# Patient Record
Sex: Male | Born: 1960
Health system: Southern US, Community
[De-identification: ages and names within clinical notes are randomized; demographics above are authoritative.]

## PROBLEM LIST (undated history)

## (undated) DIAGNOSIS — M549 Dorsalgia, unspecified: Secondary | ICD-10-CM

## (undated) DIAGNOSIS — K219 Gastro-esophageal reflux disease without esophagitis: Secondary | ICD-10-CM

## (undated) DIAGNOSIS — R1013 Epigastric pain: Secondary | ICD-10-CM

## (undated) DIAGNOSIS — M47812 Spondylosis without myelopathy or radiculopathy, cervical region: Secondary | ICD-10-CM

## (undated) DIAGNOSIS — L309 Dermatitis, unspecified: Secondary | ICD-10-CM

## (undated) DIAGNOSIS — G4700 Insomnia, unspecified: Secondary | ICD-10-CM

## (undated) DIAGNOSIS — S99919A Unspecified injury of unspecified ankle, initial encounter: Secondary | ICD-10-CM

## (undated) DIAGNOSIS — J329 Chronic sinusitis, unspecified: Secondary | ICD-10-CM

## (undated) DIAGNOSIS — C439 Malignant melanoma of skin, unspecified: Secondary | ICD-10-CM

## (undated) HISTORY — DX: Malignant melanoma of skin, unspecified: C43.9

## (undated) HISTORY — PX: MOHS SURGERY: SHX181

## (undated) HISTORY — DX: Unspecified injury of unspecified ankle, initial encounter: S99.919A

## (undated) HISTORY — DX: Dorsalgia, unspecified: M54.9

## (undated) HISTORY — DX: Spondylosis without myelopathy or radiculopathy, cervical region: M47.812

## (undated) HISTORY — DX: Insomnia, unspecified: G47.00

## (undated) HISTORY — DX: Chronic sinusitis, unspecified: J32.9

## (undated) HISTORY — DX: Epigastric pain: R10.13

## (undated) HISTORY — DX: Dermatitis, unspecified: L30.9

## (undated) HISTORY — DX: Gastro-esophageal reflux disease without esophagitis: K21.9

---

## 1990-07-31 HISTORY — PX: KNEE SURGERY: SHX244

## 1998-04-12 ENCOUNTER — Emergency Department (HOSPITAL_COMMUNITY): Admission: EM | Admit: 1998-04-12 | Discharge: 1998-04-12 | Payer: Self-pay | Admitting: *Deleted

## 1999-05-19 ENCOUNTER — Encounter: Payer: Self-pay | Admitting: Emergency Medicine

## 1999-05-19 ENCOUNTER — Emergency Department (HOSPITAL_COMMUNITY): Admission: EM | Admit: 1999-05-19 | Discharge: 1999-05-19 | Payer: Self-pay | Admitting: Emergency Medicine

## 2001-05-26 ENCOUNTER — Emergency Department (HOSPITAL_COMMUNITY): Admission: EM | Admit: 2001-05-26 | Discharge: 2001-05-26 | Payer: Self-pay | Admitting: Emergency Medicine

## 2001-05-26 ENCOUNTER — Encounter: Payer: Self-pay | Admitting: Emergency Medicine

## 2002-10-16 ENCOUNTER — Emergency Department (HOSPITAL_COMMUNITY): Admission: EM | Admit: 2002-10-16 | Discharge: 2002-10-16 | Payer: Self-pay | Admitting: Emergency Medicine

## 2002-10-16 ENCOUNTER — Encounter: Payer: Self-pay | Admitting: Emergency Medicine

## 2003-03-04 ENCOUNTER — Encounter: Payer: Self-pay | Admitting: Emergency Medicine

## 2003-03-04 ENCOUNTER — Emergency Department (HOSPITAL_COMMUNITY): Admission: EM | Admit: 2003-03-04 | Discharge: 2003-03-04 | Payer: Self-pay | Admitting: Emergency Medicine

## 2005-02-27 ENCOUNTER — Emergency Department (HOSPITAL_COMMUNITY): Admission: EM | Admit: 2005-02-27 | Discharge: 2005-02-28 | Payer: Self-pay | Admitting: Emergency Medicine

## 2005-07-31 HISTORY — PX: FACET JOINT INJECTION: SHX5016

## 2005-08-14 ENCOUNTER — Ambulatory Visit: Payer: Self-pay | Admitting: Physical Medicine & Rehabilitation

## 2005-08-14 ENCOUNTER — Encounter
Admission: RE | Admit: 2005-08-14 | Discharge: 2005-11-12 | Payer: Self-pay | Admitting: Physical Medicine & Rehabilitation

## 2005-11-08 ENCOUNTER — Ambulatory Visit: Payer: Self-pay | Admitting: Physical Medicine & Rehabilitation

## 2005-11-08 ENCOUNTER — Encounter
Admission: RE | Admit: 2005-11-08 | Discharge: 2006-02-06 | Payer: Self-pay | Admitting: Physical Medicine & Rehabilitation

## 2006-01-05 ENCOUNTER — Ambulatory Visit: Payer: Self-pay | Admitting: Physical Medicine & Rehabilitation

## 2006-01-19 ENCOUNTER — Emergency Department (HOSPITAL_COMMUNITY): Admission: EM | Admit: 2006-01-19 | Discharge: 2006-01-19 | Payer: Self-pay | Admitting: Emergency Medicine

## 2006-02-07 ENCOUNTER — Encounter
Admission: RE | Admit: 2006-02-07 | Discharge: 2006-05-08 | Payer: Self-pay | Admitting: Physical Medicine & Rehabilitation

## 2006-02-07 ENCOUNTER — Ambulatory Visit: Payer: Self-pay | Admitting: Physical Medicine & Rehabilitation

## 2006-04-18 ENCOUNTER — Ambulatory Visit: Payer: Self-pay | Admitting: Physical Medicine & Rehabilitation

## 2006-05-30 ENCOUNTER — Ambulatory Visit: Payer: Self-pay | Admitting: Physical Medicine & Rehabilitation

## 2006-05-30 ENCOUNTER — Encounter
Admission: RE | Admit: 2006-05-30 | Discharge: 2006-08-28 | Payer: Self-pay | Admitting: Physical Medicine & Rehabilitation

## 2006-07-31 DIAGNOSIS — J329 Chronic sinusitis, unspecified: Secondary | ICD-10-CM

## 2006-07-31 HISTORY — DX: Chronic sinusitis, unspecified: J32.9

## 2007-07-12 ENCOUNTER — Emergency Department (HOSPITAL_COMMUNITY): Admission: EM | Admit: 2007-07-12 | Discharge: 2007-07-13 | Payer: Self-pay | Admitting: Emergency Medicine

## 2007-11-21 IMAGING — CT CT ABDOMEN W/ CM
1 of 3 series · 14 of 32 positions shown, 19 images · IV contrast (omnipaque)
Comparison: None available.

CLINICAL DATA: Back, leg and abdominal pain.  
 ABDOMEN CT WITH CONTRAST:
TECHNIQUE: Multidetector CT imaging of the abdomen was performed following the standard protocol during bolus administration of intravenous contrast.
 Contrast:  125 cc Omnipaque 300.
TECHNIQUE: Multidetector CT imaging of the pelvis was performed following the standard protocol during bolus administration of intravenous contrast.

[Series 2: abd_pel 5.0 b40f st · axial · 0.76mm/px · z∈[-510,-50]mm · 14 of 102 slices shown, 19 images]
[im 5/102  soft-tissue]
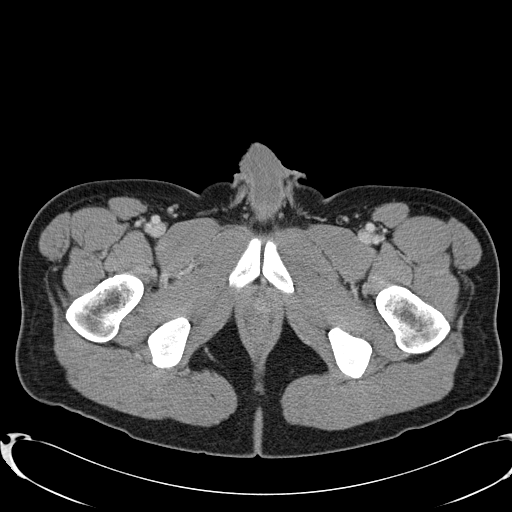
[im 5/102  bone]
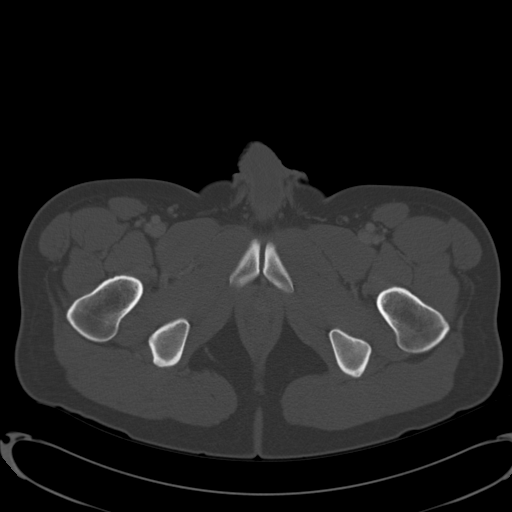
[im 15/102  soft-tissue]
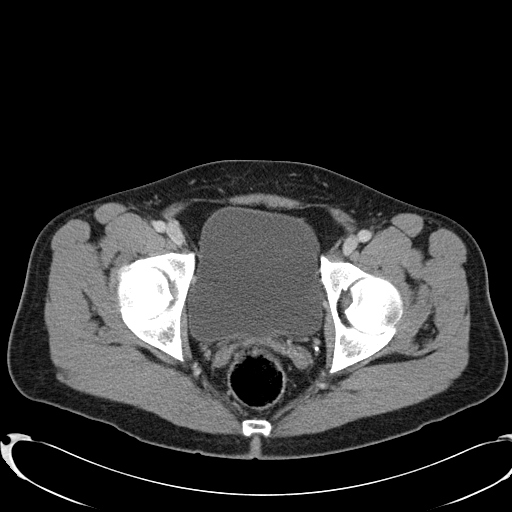
[im 20/102  soft-tissue]
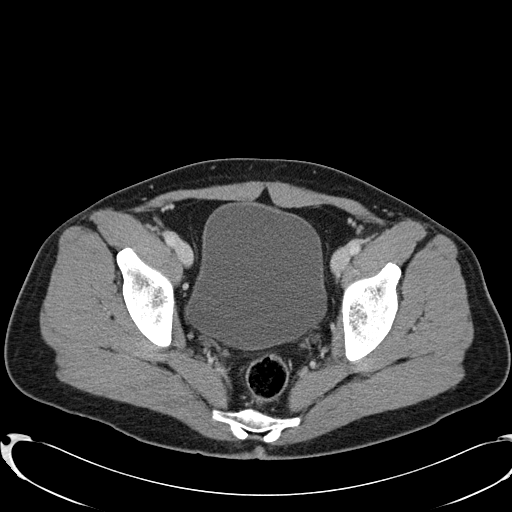
[im 29/102  soft-tissue]
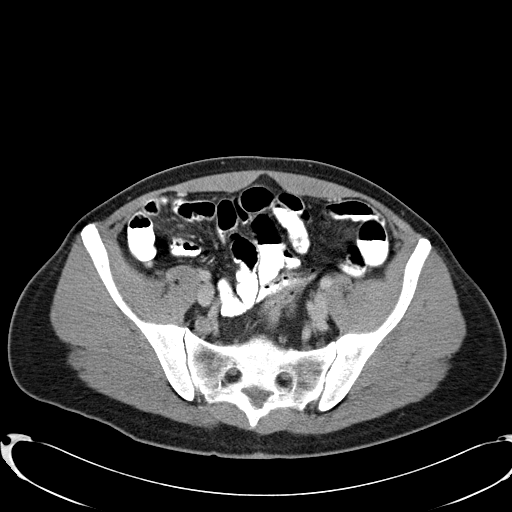
[im 34/102  soft-tissue]
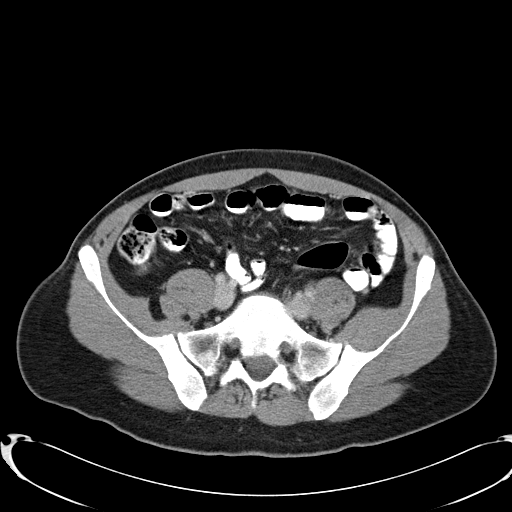
[im 44/102  soft-tissue]
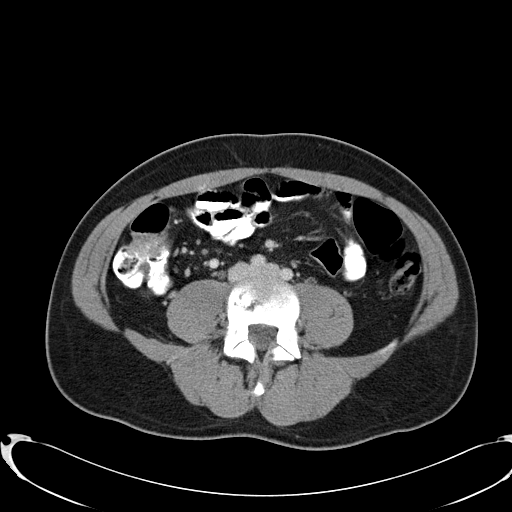
[im 53/102  soft-tissue]
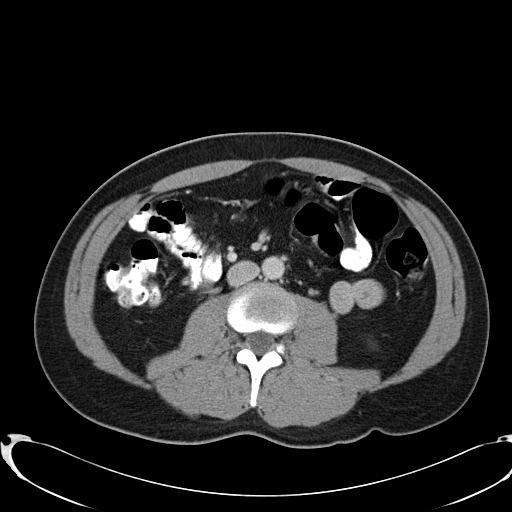
[im 58/102  soft-tissue]
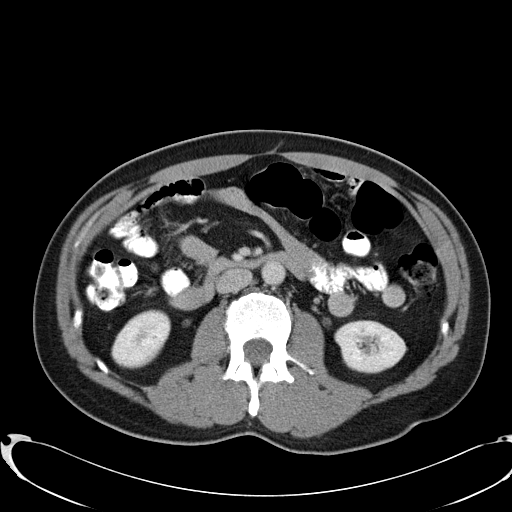
[im 68/102  soft-tissue]
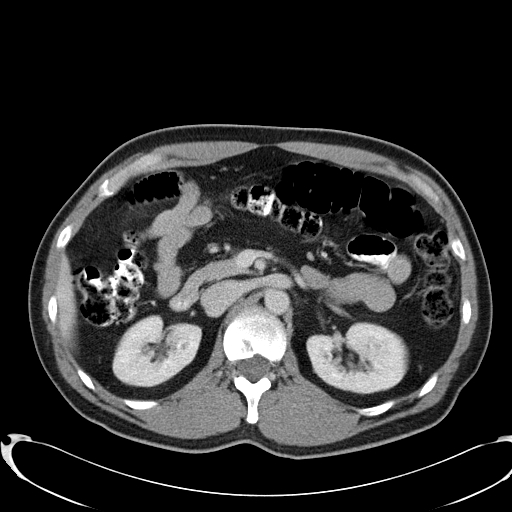
[im 68/102  bone]
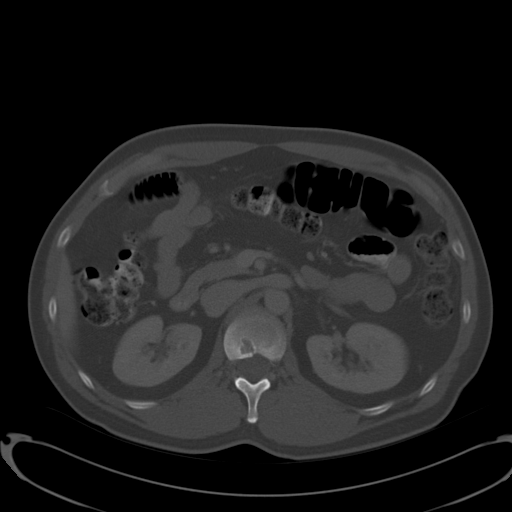
[im 73/102  soft-tissue]
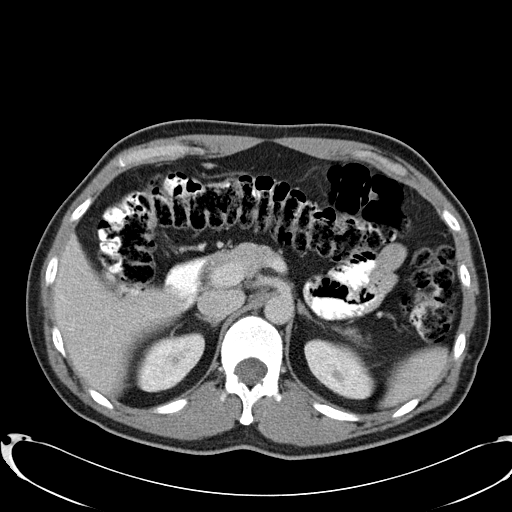
[im 82/102  soft-tissue]
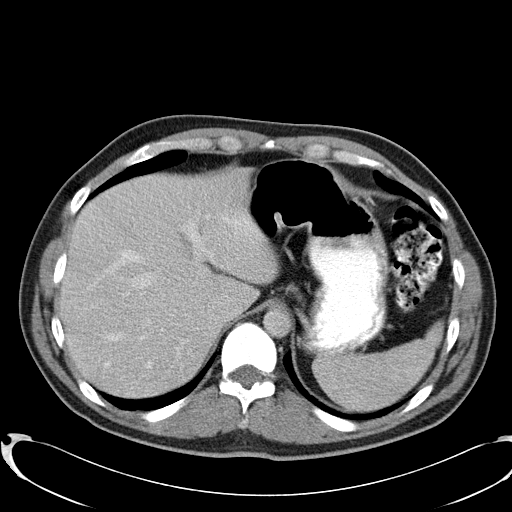
[im 82/102  lung]
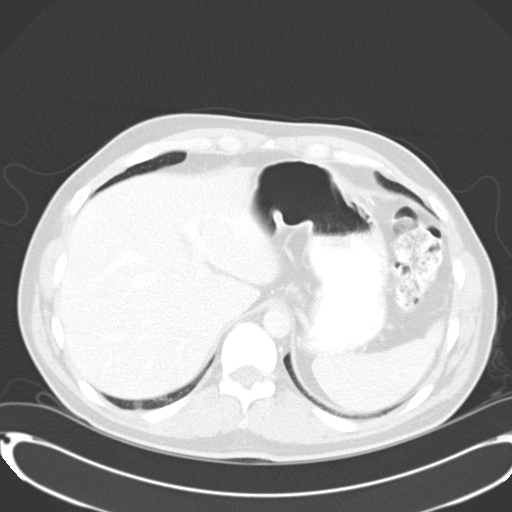
[im 87/102  soft-tissue]
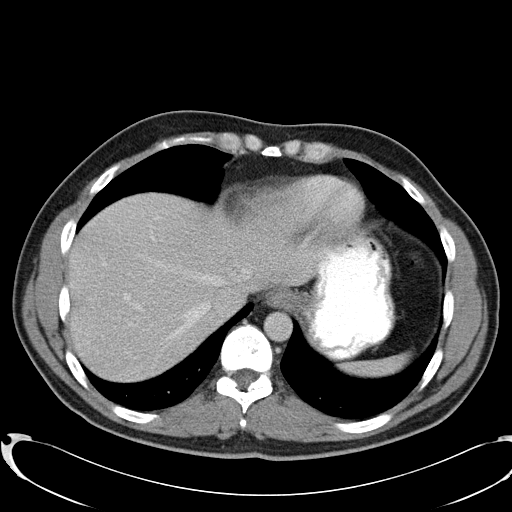
[im 87/102  lung]
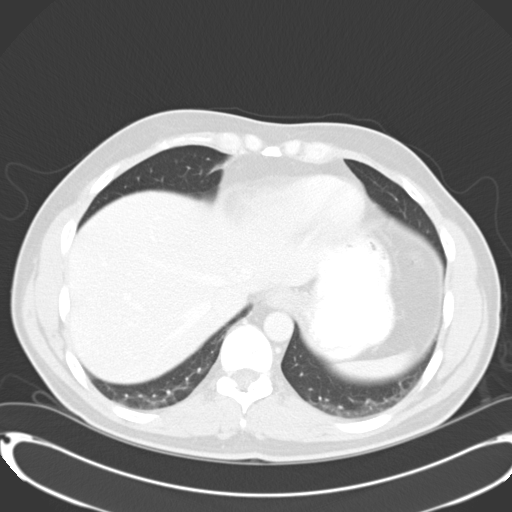
[im 92/102  lung]
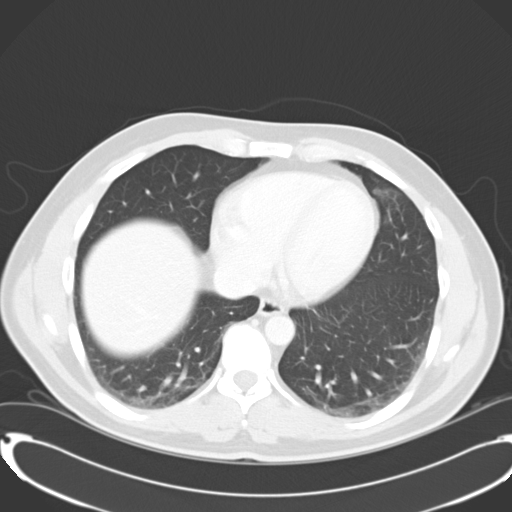
[im 97/102  soft-tissue]
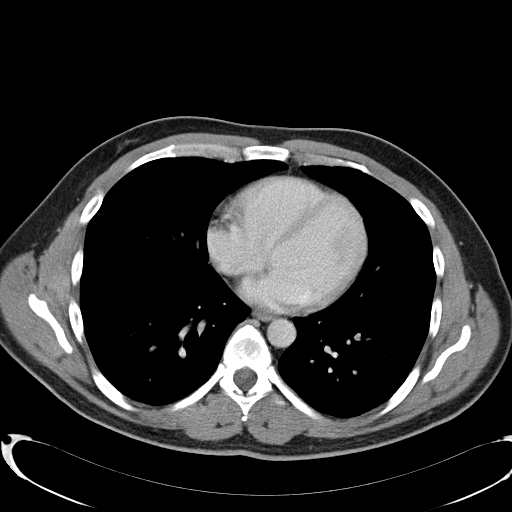
[im 97/102  lung]
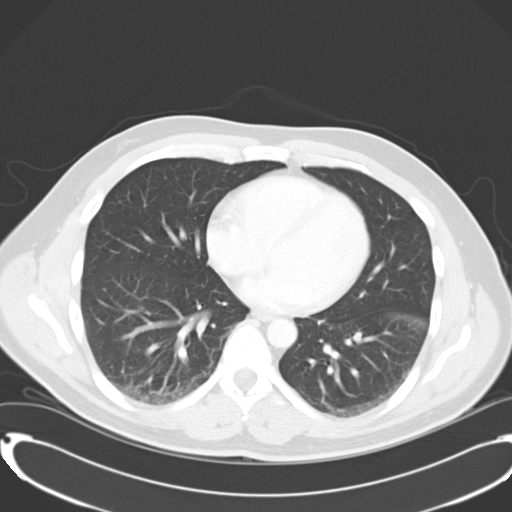

[14 of 32 positions shown; findings below may reference images not displayed]

FINDINGS: There is some dependent atelectatic change in the lung base.  No pleural or pericardial effusion.  
 Three tiny low attenuation lesions are seen in the left kidney which are too small to characterize but likely represent cysts.  The right kidney appears normal.  The liver, gallbladder, spleen, pancreas, and adrenal glands all appear normal.  The stomach and small bowel have a normal CT appearance.  No abdominal lymphadenopathy or abnormal fluid collection.
IMPRESSION: No acute finding in the abdomen. 
 PELVIS CT WITH CONTRAST:
FINDINGS: The appendix is well visualized and appears normal.   There is no pelvic fluid collection or lymphadenopathy.  The patient is known to have a redundant sigmoid colon but colon is otherwise unremarkable in appearance without evidence of diverticulitis or diverticulosis.  No pelvic fluid collection or lymphadenopathy.  No focal bony abnormality. 
 Tiny splenic focus in the right iliac wing likely represents a tiny bone island.  There is some degenerative change in the spine.
IMPRESSION: No acute finding in the pelvis.

## 2008-07-31 DIAGNOSIS — C439 Malignant melanoma of skin, unspecified: Secondary | ICD-10-CM

## 2008-07-31 HISTORY — DX: Malignant melanoma of skin, unspecified: C43.9

## 2010-12-16 NOTE — Procedures (Signed)
NAMEJARION, Jonathan Todd                ACCOUNT NO.:  [id number]   MEDICAL RECORD NO.:  [id number]          PATIENT TYPE:  REC   LOCATION:  [facility]                          FACILITY:  [facility]   PHYSICIAN:  Erick Colace, M.D.DATE OF BIRTH:  1960-11-11   DATE OF PROCEDURE:  11/09/2005  DATE OF DISCHARGE:                                 OPERATIVE REPORT   PROCEDURE:  Bilateral L4 and L3 medial branch blocks, bilateral L5 dorsal  ramus injections under fluoroscopic guidance.   INDICATIONS FOR PROCEDURE:  Prior medial branch blocks which increased his  level of activity and decreased morning stiffness performed September 11, 2005.   Informed consent was obtained after describing the risks and benefits of the  procedure to the patient and these include bleeding, bruising, infection,  loss of bowel or bladder function, temporary or permanent paralysis. She  elected to proceed and was given written consent. The patient was placed  prone on the fluoroscopy table, Betadine prep and sterile drape. A 25 gauge  inch and a half needle was used to anesthetize the skin and subcu tissue  with 1% lidocaine x2 mL at each of 6 sites. Then a 22 gauge 3-1/2 inch  spinal needle was inserted first on the left to target the left S1 SAP  transprocess junction, bone contact made confirmed with lateral imaging.  Omnipaque 180 x 0.5 mL demonstrated no intravascular uptake and 0.5 mL of a  solution containing 1 mL of 40 mg/mL Depo-Medrol and 2 mL of 2% MP free  lidocaine was injected. The right S1 SAP sacroiliac junction was targeted,  bone contact made and confirmed with lateral imaging. Omnipaque 180 x 0.5 mL  demonstrated no intravascular uptake and then 0.5 mL of Depo-Medrol  lidocaine solution were injected. Next, C-arm was oblique 10 degrees short  to the right with targeting right L5 SAP transprocess junction, bone contact  made and confirmed with lateral imaging. Omnipaque 180 x 0.5 mL demonstrated  no  intravascular uptake and then 0.5 mL of Depo-Medrol lidocaine solution  was injected then the right L4 SAP transprocess junction targeted and bone  contact made confirmed with lateral imaging. Omnipaque 180 x 0.5 mL  demonstrated no intravascular uptake and 0.5 mL of Depo-Medrol lidocaine  solution were injected. Next, C-arm was oblique towards the left, 10 degrees  left L4 SAP transprocess junction targeted bone contact made confirming  lateral imaging. Omnipaque 180 x 0.5 mL demonstrated no intravascular uptake  and 0.5 mL of Depo-Medrol lidocaine solution injected and then the left L5  SAP transprocess junction targeted, bone contact made, confirmed with  lateral imaging. Omnipaque 180 x 0.5 mL demonstrated no intravascular uptake  and 0.5 mL of Depo-Medrol lidocaine solution was injected. The patient  tolerated the procedure well, post injection instructions given.      Erick Colace, M.D.  Electronically Signed     AEK/MEDQ  D:  11/09/2005 17:49:06  T:  11/10/2005 07:48:32  Job:  [id number]

## 2010-12-16 NOTE — Procedures (Signed)
NAMESKANDA, WORLDS NO.:  [id number]   MEDICAL RECORD NO.:  [id number]          PATIENT TYPE:  REC   LOCATION:  [facility]                          FACILITY:  [facility]   PHYSICIAN:  Erick Colace, M.D.DATE OF BIRTH:  [birth year]   DATE OF PROCEDURE:  06/28/2006  DATE OF DISCHARGE:                               OPERATIVE REPORT   Thursday, June 28, 2006.   PROCEDURE:  Bilateral L5 dorsal ramus injection with L4 medial branch  block, L3 medial branch block under fluoroscopic guidance.   INDICATIONS:  Lumbar axial pain activity related and positional mainly  with extension.  Had one set of medial branch blocks that provided very  good pain relief and the other which did not.   The patient is not on any anticoagulant medications or antibiotics.   Informed consent was obtained after describing risks and benefits of the  procedure with the patient.  These include bleeding, bruising,  infection, loss of bowel and bladder function, temporary or permanent  paralysis.  He elects to proceed and has given written consent.  The  patient placed prone on fluoroscopy table with Betadine prep, sterile  drape.  A 25-gauge, 1.5-inch needle was used to incise skin and  subcutaneous tissue with 1% lidocaine x2 mL.  A 22 gauge, 3.5-inch  spinal needle was inserted under fluoroscopic guidance first starting at  the left S1 SAP sacral ala junction.  Bone contact made and confirmed  with lateral imaging.  Omnipaque 180 x 0.5 mL demonstrated no  intravascular uptake.  Then 0.5 mL of a solution containing 1 mL of 40  mg/mL Depo-Medrol and 2 mL of 2% methylparaben-free lidocaine were  injected.  Then the right S1 SAP sacral ala junction targeted.  Bone  contact made confirmed with lateral imaging.  Omnipaque 180 x 0.5 mL  demonstrated no intravascular uptake then 0.5 mL of the Depo-Medrol  lidocaine solution was injected.  The right L5 SAP transverse process  junction targeted.  Bone  contact made confirmed with lateral imaging.  Omnipaque 180 x 0.5 mL demonstrated no intravascular uptake and 0.5 mL  of the Depo-Medrol lidocaine solution was injected.  Next, the right L4  SAP transverse process junction targeted.  Bone contact made and  confirmed with lateral imaging.  Omnipaque 180 x 0.5 mL demonstrated no  intravascular uptake and 0.5 mL of the Depo-Medrol lidocaine solution  was injected.  The left L4 SAP transverse process junction targeted.  Bone contact made and confirmed with lateral imaging.  Omnipaque 180 x  0.5 mL demonstrated no intravascular uptake and 0.5 mL of the Depo-  Medrol lidocaine solution injected.  Then, the left L5 SAP transverse  process junction targeted.  Bone contact made confirmed with lateral  imaging.  Omnipaque 180 x 0.5 mL demonstrated no intravascular take then  0.5 mL of Depo-Medrol lidocaine solution was injected.  The patient  tolerated the procedure well.  Postinjection instructions given.  Preinjection and postinjection vitals stable.  Pain  went from 3 to 0.  He will fax in a pain diary and if this proves to  be  helpful as the first set of injections were, schedule for radiofrequency  neurotomy.      Erick Colace, M.D.  Electronically Signed     AEK/MEDQ  D:  06/28/2006 17:31:37  T:  06/28/2006 21:13:38  Job:  [id number]

## 2010-12-16 NOTE — Assessment & Plan Note (Signed)
The patient returns today, had bilateral L3 and L4 medial branch blocks and  L5 dorsal ramus injection under fluoroscopic guidance November 09, 2005.  He  states he did not get much relief after the second medial branch block and  his pain only went down from a 4 to a 3.  His previous medial branch blocks  were done September 11, 2005, and he states that he became pain-free in the  morning for a period of a couple of weeks.   He describes the pain as being the low back-upper buttock area bilaterally,  no significant lower extremity radiation, some radiation to the hip.   His medications consist of intermittent ibuprofen.   Activity level was about 45 hours a week.  He would like to do some more  golfing.  He just has swung a golf club a bit, but this causes some pain.   PHYSICAL EXAMINATION:  His back range of motion is good in terms of forward  flexion, gets about to his ankles with his hands, and extension is about 50%  range.  His main pain is getting up from a fully forward-flexed position to  the extended position.  He has normal strength in the lower extremities,  normal range of motion.  He has some soreness of his PSIS bilaterally but  Faber's is equivocal in that area.   IMPRESSION:  Lumbar pain with facet medial branch blocks which were  inconclusive and physical exam findings suggestive of sacroiliac joint  arthropathy.  Will do sacroiliac joint injections.  We discussed the  procedure and used the spinal model.      Erick Colace, M.D.  Electronically Signed     AEK/MedQ  D:  12/07/2005 16:15:13  T:  12/08/2005 14:33:08  Job #:  [id number]

## 2010-12-16 NOTE — Assessment & Plan Note (Signed)
HISTORY:  A 50 year old male who has had on and off back problems for a  number of years.  Has been evaluated by three orthopedics surgeons as well  as neurosurgery.  This pain has been worsened by physical activity and in  fact he is quite reluctant to engage in anything follow flare up while  hitting balls on the driving range one a half years ago.  No surgical  intervention has been recommended.  His lumbar MRI was done in 2005.  Degenerative disc L2/3, L4/5, L5-S1, minimal changes L1/2 and L3/4 was  fairly normal.   He has been through pain management procedures including lumbar medial  branch blocks which  did not produce any significant improvements in his  pain; therefore, facet etiology ruled out and his last injection was  sacroiliac joint under fluoroscopic guidance on the right side which also  did not cause any significant improvement.   He has not had any recent flare ups but had a flare up in late June and this  was treated with Naprosyn x2 weeks with some improvement.   He continues to work 40 hours a week at [employer].  This pain goes from a  2 to 4 on average but interferes with activity, relationship with others and  enjoyment of life out a 6-7/10 level.   SOCIAL HISTORY:  He is married, lives with his wife and son.  Drinks alcohol  about three times a week but not to excess.   His blood pressure is 121/71, pulse is 62, respirations 18, O2 saturation  97% room air.  His back has minimal tenderness to palpation over the  lumbosacral junction.  He has pain with both in the range of flexion and  extension.  He has approximately three-quarters range forward flexion and  50% range in extension.  He does exhibit some mild elevation of right  hemipelvis compared to the left.  His lower extremity strength is normal.  Deep tendon reflexes are normal and his gait shows no evidence of toe drag  or knee instability.   IMPRESSION:  Lumbar degenerative disc, appears mainly  symptomatic lower  levels, i.e. lumbar vertebrae-4/5 and lumbar verterbrae-5/sacral vertebrae-  1.  He has not responded to injections of other potential pain generators.  He is reluctant to use any type of long-term medications.  We discussed  physical therapy options including more traditional core strengthening  exercise versus more of a postural evaluation followed by strengthening and  we will choose the latter.  He is aware that he will have to go about two  times a week and may need to go for several weeks.   I will see him back in four to six weeks to monitor his progress.  Other  treatment options including acupuncture were discussed with him, which can  be done here at [facility] but we will hold off on this.      Erick Colace, M.D.  Electronically Signed     AEK/MedQ  D:  04/19/2006 16:56:06  T:  04/21/2006 18:07:19  Job #:  [id number]   cc:   Cristi Loron, M.D.  Fax: [phone]   Candyce Churn, M.D.  Fax: [phone]

## 2010-12-16 NOTE — Group Therapy Note (Signed)
REASON FOR REFERRAL:  Low back pain.   HISTORY:  A 50 year old male who had back pain intermittently in cause and  then increasing in frequency in his late 69s.  He did not recall any  significant injury.  Approximately 10 years ago, he had increasing back pain  and had x-rays as well as MRI scan which was reportedly showing degenerative  disk.  He has been treated intermittently with anti-inflammatories which  have been helpful for him.  He has had rheumatologic workup which was  reportedly negative.  He tried physical therapy some years ago but not  recently.  He states he used to be quite physically active, playing multiple  different sports, working out, etc., but has not really been doing this  because of pain.  He rates his pain currently at 3/10 and states this is a  good day.  Pain does interfere with general activities, enjoyment of life,  and relationship with others at 5/10.  Pain is worse in the morning.  Sleep  is fair.  Worse with walking, bending, improves with rest.  He climbs steps,  drives, walks without assistance and is employed 45 hours a week in a  sedentary type job.   REVIEW OF SYSTEMS:  Otherwise negative.  Denies any lower extremity pain.  No bowel or bladder dysfunction.   SOCIAL HISTORY:  He is married, drinks about 3 alcoholic beverages per week.  He has a 39-year-old son he would like to play with more but feels his back  pain limits him in terms of this.   FAMILY HISTORY:  Positive for heart disease, high blood pressure.   PAST SURGICAL HISTORY:  Right knee arthroscopic surgery.   PHYSICAL EXAMINATION:  GENERAL:  No acute distress.  Mood and affect  appropriate.  VITAL SIGNS:  Blood pressure 117/65, pulse 64, respiratory rate 16, O2  saturation 98% on room air.  The back does feel tenderness to palpation, possibly 50% forward flexion and  extension, with 5% lateral bending.  Lateral bending is the most painful and  is to both sides.   He has no  positive range of motion, but Faber's testing does cause some hip  pain on the right side in the groin area.   He has no tenderness to palpation bilateral greater trochanters.  He has  full range of motion of knees and ankles.  He has normal sensation bilateral  extremities, no other muscle atrophy.  He has normal deep tendon reflexes.   IMPRESSION:  Back pain with MRI findings suggestive of degenerative disk  disease.  He may have facet arthropathy given history of inability to sleep  on his stomach any more.  Sacroiliac joint less likely involved based on  location as well as bilateral nature.   PLAN:  1.  Will do diagnostic injection needle biopsies. Described risks and      benefits and gave information sheet on this.  Will proceed with this.  2.  Will given him some Sonata prior to the injection to relax him.  3.  I offered to write for Celebrex; however, he would like to keep      medications to a minimum and first do diagnostic and possibly      therapeutic injections.  I discussed other treatment options including      radiofrequency      neurotomy and need to proceed with other diagnostic injections; i.e.,      sacroiliac joint should  facet  injections prove not  particularly      helpful.  He will need some physical therapy but would like to reduce      pain level first.      Erick Colace, M.D.  Electronically Signed     AEK/MedQ  D:  08/15/2005 13:54:14  T:  08/15/2005 16:10:38  Job #:  [id number]   cc:   Cristi Loron, M.D.  Fax: [phone]

## 2010-12-16 NOTE — Procedures (Signed)
Jonathan Todd, Jonathan Todd                ACCOUNT NO.:  [id number]   MEDICAL RECORD NO.:  [id number]          PATIENT TYPE:  REC   LOCATION:  [facility]                          FACILITY:  [facility]   PHYSICIAN:  Erick Colace, M.D.DATE OF BIRTH:  10-10-1960   DATE OF PROCEDURE:  09/11/2005  DATE OF DISCHARGE:                                 OPERATIVE REPORT   PROCEDURE:  Bilateral L5 dorsal ramus injection, bilateral L4 medial branch  block, bilateral L3 medial branch block under fluoroscopic guidance.   INDICATION FOR INJECTION:  Back pain with MRI findings to suggest sacroiliac  disease but with exam findings more consistent with facet arthropathy.   DESCRIPTION OF PROCEDURE:  Informed consent was obtained after describing  the risks and benefits of the procedure with the patient.  They include  bleeding, bruising, infection, loss of bowel and bladder function, temporary  or permanent paralysis.  He elects to proceed and has given written consent.  The patient placed prone on the fluoroscopy table, Betadine prepped, and  sterile draped.  A 25-gauge, inch-and-a-half needle was used to anesthetize  the skin and subcu tissues with 1% Lidocaine x1.5 ml at each of 6 sites.  Then, a 22-gauge, three-and-a-half inch spinal needle was inserted under  fluoroscopic guidance, targeting first the left S1 SAP-sacroiliac junction,  bone contact made and confirmed with lateral imaging, and then the Omnipaque-  180 x0.5 ml demonstrated no intravascular uptake.  Then, a solution  containing 0.5 ml of Depo-Medrol plus 2.5 ml of 2% methylparaben-free  Lidocaine were injected.  Next, the right S1 SAP-sacroiliac junction was  targeted, bone contact made and confirmed with lateral imaging.  Omnipaque-  180 x 0.5 ml demonstrated no intravascular uptake, then 0.5 ml of the Depo-  Medrol/Lidocaine solution were injected.  Then, the C-arm was obliqued 10  degrees towards the right, the right L5 SAP-transverse process  junction  targeted, bone contact made and confirmed with lateral imaging, then 0.5 ml  of the Depo-Medrol/Lidocaine solution were injected.  Then, the L4 SAP-  transverse process junction targeted, bone contact made and confirmed with  lateral imaging, then 0.5 ml of the Depo-Medrol/Lidocaine solution were  injected.  Next, the C-arm was obliqued 10 degrees toward the left.  The  left L4 SAP-transverse process junction targeted, bone contact made and  confirmed with lateral imaging, and then 0.5 ml of the Depo-Medrol/Lidocaine  solution injected.  Last, the left L5 SAP-transverse process junction was  targeted, bone contact made and confirmed with lateral imaging, then 0.5 ml  of the Depo-Medrol/Lidocaine solution were injected.  The patient tolerated  the procedure well.  Pre- and post injection pain levels and vitals  monitored, stable at discharge.  Repeat in 3 weeks should he have at least  50% pain relief lasting from hours to days.  If no significant improvement  would trial bilateral sacroiliac injections.      Erick Colace, M.D.  Electronically Signed     AEK/MEDQ  D:  09/11/2005 12:18:38  T:  09/11/2005 21:36:03  Job:  [id number]

## 2010-12-16 NOTE — Procedures (Signed)
NAME:  Jonathan Todd, Jonathan Todd NO.:  [id number]   MEDICAL RECORD NO.:  [id number]          PATIENT TYPE:  REC   LOCATION:  [facility]                          FACILITY:  [facility]   PHYSICIAN:  Erick Colace, M.D.DATE OF BIRTH:  [birth year]   DATE OF PROCEDURE:  DATE OF DISCHARGE:                                 OPERATIVE REPORT   PROCEDURE:  Right sacroiliac joint injection with fluoroscopic guidance.   INDICATIONS:  Low back pain of undetermined etiology, equivocal results with  facet injections, as buttock pain in addition to back pain.   Informed consent was obtained, after describing risks and benefits of the  procedure to the patient, to include bleeding, bruising, infection, lower  extremity weakness or paralysis.  He elects to proceed and has given written  consent.   A 25 gauge 1-1/2 inch needle is used to anesthetize the skin and subcu  tissues.  Lidocaine 1% x2 cc and a 25 gauge 3 inch spinal needle is inserted  in the right SI joint under fluoroscopic guidance, plus AP and lateral  imaging  showed a good joint arthrogram followed by injection of Depo-Medrol  40 mg/cc x0.5 cc plus 2% lidocaine.  MPF x 0.5 cc.  Patient tolerated the  procedure well.  No post injection complications.   Post procedure instructions given.  He will see me in one month.      Erick Colace, M.D.  Electronically Signed     AEK/MEDQ  D:  01/08/2006 13:30:31  T:  01/08/2006 18:18:08  Job:  [id number]

## 2010-12-16 NOTE — Assessment & Plan Note (Signed)
A 50 year old male with back pain, mainly activity-related.  He has had some  degenerative disk noted at L2-3, L4-5 and L5-S1 on a lumbar MRI done in  2005.  He had one set of medial branch blocks, which provided a very good  relief of pain, providing both reduction of pain with activity as well as an  ability to lie on his stomach for the first time in a long time.  The second  set, however, did not produce any pain.  I did review the procedures and the  fluoroscopic images, and the main difference is that the right side L4  medial branch needle was not as deep as on the previous, more successful  injection.   He had no other recent flare-ups of pain.  He continues to work 40 hours per  week.   His pain is around the lower back area, radiating to the lateral buttock.   PHYSICAL EXAMINATION:  VITAL SIGNS:  Blood pressure is 133/59, pulse 89,  respiratory rate 16, O2 saturation 98% on room air.  GENERAL:  In no acute distress.  MUSCULOSKELETAL/NEUROLOGIC:  No tenderness to palpation.  He has pain with  extension of the spine.  He has normal deep tendon reflexes, normal strength  of the lower extremities, and normal sensation.   IMPRESSION:  Lumbar pain with one set of medial branch blocks producing good  relief pain and the second not being helpful.  He has already gone through  physical therapy at [facility] and at this point does not feel it  has been particularly helpful.  He would like to reconsider a third set of  medial branch blocks, which I think is reasonable given the good response  the first time.  In addition, he has had a CT abdomen and pelvis, which  showed no intra-abdominal problem to explain his low back pain.   I will see him back for the injection.      Erick Colace, M.D.  Electronically Signed     AEK/MedQ  D:  05/31/2006 17:46:34  T:  06/01/2006 09:14:21  Job #:  [id number]   cc:   [facility]

## 2013-02-28 DIAGNOSIS — L309 Dermatitis, unspecified: Secondary | ICD-10-CM

## 2013-02-28 HISTORY — DX: Dermatitis, unspecified: L30.9

## 2013-06-09 ENCOUNTER — Encounter: Payer: Self-pay | Admitting: Diagnostic Neuroimaging

## 2013-06-09 ENCOUNTER — Ambulatory Visit (INDEPENDENT_AMBULATORY_CARE_PROVIDER_SITE_OTHER): Payer: BC Managed Care – PPO | Admitting: Diagnostic Neuroimaging

## 2013-06-09 VITALS — BP 118/75 | HR 58 | Temp 97.9°F | Ht 72.0 in | Wt 193.0 lb

## 2013-06-09 DIAGNOSIS — F29 Unspecified psychosis not due to a substance or known physiological condition: Secondary | ICD-10-CM

## 2013-06-09 DIAGNOSIS — R410 Disorientation, unspecified: Secondary | ICD-10-CM

## 2013-06-09 NOTE — Progress Notes (Signed)
[facility]  PATIENT: Jonathan Todd DOB: Jul 08, 1961  REFERRING CLINICIAN: R Gates HISTORY FROM: patient REASON FOR VISIT: new consult   HISTORICAL  CHIEF COMPLAINT:  Chief Complaint  Patient presents with  . Neurologic Problem    transient alteration of awareness    HISTORY OF PRESENT ILLNESS:   52 year old right-handed male here for evaluation of transient confusional episodes.  January 2014 patient was at work, working on a moderately complex, stressful computer task when all of a sudden he was unable to think about what the next up would be. He struggled with this for some time and eventually had to ask someone else to help him out. Later that day he was able to think normally. During the event he had no focal physical symptoms. No slurred speech, word finding difficulties or headache.  June 2014, patient was visiting [place] with his family and he was helping to navigate through the city with walking directions and some weight directions. At one point they went into the subluxation, patient looked at the mouth and he had no idea how to read the map or take the next step. Patient's 36 year old son had to help him navigate to the next location. Again patient had no other focal physical or cognitive symptoms.  September 2014 patient was at his son's baseball game scoring the game. Towards the end of the game he became confused and was unable to identify about players positions by number. He had no idea how to continue scoring the game. He had to ask another person to complete the task. Patient went for a walk to clear his head. When he was going back home he had a momentary confusion of the accelerator versus break in his car.  In between these events patient has functioned normally cognitively. He is sleeping 6 hours at night which is normal for him. No increased stress either physical or motion normal and the last one to 2 years. He has had a few other  mild episodes, such as waking up in the middle the night slightly disoriented, but she did not pay much attention to. All the events have occurred with fairly sudden onset. They were also associated with reactionary anxiety after the onset of symptoms, but not preceded by anxiety or stress.  4-5 years ago patient had one episode of presyncope when he was at home, in the kitchen walking to the coffee machine. He felt lightheaded, dizzy, faint, held onto the counter and the episode passed.  No olfactory hallucinations, auditory or visual hallucinations, dj vu spells, out of body experiences, distortion of space or time, or visual distortions. No history of syncope or seizure.  REVIEW OF SYSTEMS: Full 14 system review of systems performed and notable only for cramps and confusion episodes.  ALLERGIES: Allergies  Allergen Reactions  . Aspirin     Baby aspirin: severe dyspepsia  . Codeine Itching    HOME MEDICATIONS: No outpatient prescriptions prior to visit.   No facility-administered medications prior to visit.    PAST MEDICAL HISTORY: Past Medical History  Diagnosis Date  . Back pain     secondary to L3,4 degenerative disc disease  . GERD (gastroesophageal reflux disease)   . Insomnia   . Melanoma 2010    L shoulder  . Ankle injury     R ankle  . Sinusitis 2008  . Dyspepsia     secondary to ASA 81mg  causing esophageal spasm  . DJD (degenerative joint disease), cervical   .  Dermatitis 02/28/2013    subacute spongiotic dermatitis, R hip area    PAST SURGICAL HISTORY: Past Surgical History  Procedure Laterality Date  . Facet joint injection  2007    x2  . Knee surgery  1992    arthroscopic  . Mohs surgery      shoulder    FAMILY HISTORY: Family History  Problem Relation Age of Onset  . Heart attack Mother   . Cancer Mother     skin  . Heart disease Mother   . Cancer Sister     skin  . Hypertension Sister   . Hypertension Sister   . Heart attack Maternal  Grandfather     SOCIAL HISTORY:  History   Social History  . Marital Status: Married    Spouse Name: Enid Derry    Number of Children: 1  . Years of Education: Ungrad   Occupational History  .  Other    [employer]   Social History Main Topics  . Smoking status: Never Smoker   . Smokeless tobacco: Never Used  . Alcohol Use: Yes     Comment: 1-2 per week  . Drug Use: No  . Sexual Activity: Not on file   Other Topics Concern  . Not on file   Social History Narrative   Patient lives at home with family.   Caffeine Use: 2-3 cups of coffee daily     PHYSICAL EXAM  Filed Vitals:   06/09/13 0925  BP: 118/75  Pulse: 58  Temp: 97.9 F (36.6 C)  TempSrc: Oral  Height: 6' (1.829 m)  Weight: 193 lb (87.544 kg)    Not recorded    Body mass index is 26.17 kg/(m^2).  GENERAL EXAM: Patient is in no distress  CARDIOVASCULAR: Regular rate and rhythm, no murmurs, no carotid bruits  NEUROLOGIC: MENTAL STATUS: awake, alert, language fluent, comprehension intact, naming intact CRANIAL NERVE: no papilledema on fundoscopic exam, pupils equal and reactive to light, visual fields full to confrontation, extraocular muscles intact, no nystagmus, facial sensation and strength symmetric, uvula midline, shoulder shrug symmetric, tongue midline. MOTOR: normal bulk and tone, full strength in the BUE, BLE SENSORY: normal and symmetric to light touch, temperature, vibration COORDINATION: finger-nose-finger, fine finger movements normal REFLEXES: deep tendon reflexes present and symmetric; DOWN GOING TOES. GAIT/STATION: narrow based gait; romberg is negative   DIAGNOSTIC DATA (LABS, IMAGING, TESTING) - I reviewed patient records, labs, notes, testing and imaging myself where available.  No results found for this basename: WBC, HGB, HCT, MCV, PLT   No results found for this basename: na, k, cl, co2, glucose, bun, creatinine, calcium, prot, albumin, ast, alt, alkphos, bilitot, gfrnonaa, gfraa     No results found for this basename: CHOL, HDL, LDLCALC, LDLDIRECT, TRIG, CHOLHDL   No results found for this basename: HGBA1C   No results found for this basename: VITAMINB12   No results found for this basename: TSH   B12 - 396  TSH 1.36  Vit D - 30.8  LDL 131   ASSESSMENT AND PLAN  51 y.o. year old male here with transient episodes of confusion, specifically with organization, navigation, right left confusion. Symptoms could localize to dominant parietal lobe. Differential diagnosis includes complex partial seizure versus TIA.  PLAN: - I will check MRI, MRA head and EEG  Orders Placed This Encounter  Procedures  . MR MRA HEAD WO CONTRAST  . MR Brain W Wo Contrast  . EEG adult   Return in about 3 months (around 09/09/2013).  Suanne Marker, MD 06/09/2013, 10:50 AM Certified in Neurology, Neurophysiology and Neuroimaging  [facility] [address], [address] [place], [place] [address] [phone]

## 2013-06-09 NOTE — Patient Instructions (Signed)
I will check MRI, MRA brain and EEG.

## 2013-06-17 ENCOUNTER — Ambulatory Visit (INDEPENDENT_AMBULATORY_CARE_PROVIDER_SITE_OTHER): Payer: BC Managed Care – PPO | Admitting: Radiology

## 2013-06-17 DIAGNOSIS — F29 Unspecified psychosis not due to a substance or known physiological condition: Secondary | ICD-10-CM

## 2013-06-17 DIAGNOSIS — R410 Disorientation, unspecified: Secondary | ICD-10-CM

## 2013-06-19 NOTE — Procedures (Signed)
HISTORY: 52 years old male, presenting with transient confusion episode, complex partial seizure vs. TIA  TECHNIQUE:  16 channel EEG was performed based on standard 10-16 international system. One channel was dedicated to EKG, which has demonstrated sinus rhythm of 54 beats per minutes.  Upon awakening, the posterior background activity was well-developed, in alpha range 10hz , with amplitude of 40 microvoltage, reactive to eye opening and closure.  There was no evidence of epilepsy for discharge.  Photic stimulation was performed, which induced a symmetric photic driving.  Hyperventilation was performed, there was no abnormality elicit.  No sleep was achieved.  CONCLUSION: This is a  normal awake EEG.  There is no electrodiagnostic evidence of epileptiform discharge

## 2013-08-06 ENCOUNTER — Telehealth: Payer: Self-pay | Admitting: Diagnostic Neuroimaging

## 2013-08-06 NOTE — Telephone Encounter (Signed)
Called patient to reschedule 09/18/13 appointment because Dr. Leta Baptist on vacation, patient states he had EEG over a month ago and has not heard back about the results. Please call the patient with results.

## 2013-09-16 NOTE — Telephone Encounter (Signed)
Please call patient with normal EEG results. -VRP

## 2013-09-16 NOTE — Telephone Encounter (Signed)
Called patient to give EEG results. Patient said he had already been informed earlier.

## 2013-09-18 ENCOUNTER — Ambulatory Visit: Payer: BC Managed Care – PPO | Admitting: Diagnostic Neuroimaging

## 2013-10-27 ENCOUNTER — Ambulatory Visit: Payer: BC Managed Care – PPO | Admitting: Diagnostic Neuroimaging

## 2013-11-11 ENCOUNTER — Ambulatory Visit: Payer: BC Managed Care – PPO | Admitting: Diagnostic Neuroimaging

## 2014-01-09 ENCOUNTER — Ambulatory Visit: Payer: BC Managed Care – PPO | Admitting: Diagnostic Neuroimaging

## 2014-04-07 ENCOUNTER — Telehealth: Payer: Self-pay | Admitting: Cardiology

## 2014-04-07 ENCOUNTER — Other Ambulatory Visit: Payer: Self-pay | Admitting: Nurse Practitioner

## 2014-04-07 DIAGNOSIS — R0789 Other chest pain: Secondary | ICD-10-CM

## 2014-04-07 DIAGNOSIS — R079 Chest pain, unspecified: Secondary | ICD-10-CM

## 2014-04-07 NOTE — Telephone Encounter (Signed)
Sent message to [facility] to schedule ASAP. Once I have date and time I will call pt with instructions.

## 2014-04-07 NOTE — Telephone Encounter (Signed)
Dr. Irish Lack spoke with Dr. Inda Merlin regarding patient symptoms post discharge. Per Dr. Irish Lack order Gxt dx code: 786.50.

## 2014-04-07 NOTE — Telephone Encounter (Signed)
Gxt scheduled for 04/10/14 at 12:30am. Pt notified and instructions given.

## 2014-04-07 NOTE — Addendum Note (Signed)
Addended byUlla Potash H on: 04/07/2014 01:29 PM   Modules accepted: Orders

## 2014-04-10 ENCOUNTER — Ambulatory Visit (INDEPENDENT_AMBULATORY_CARE_PROVIDER_SITE_OTHER): Payer: BC Managed Care – PPO | Admitting: Interventional Cardiology

## 2014-04-10 ENCOUNTER — Institutional Professional Consult (permissible substitution): Payer: Self-pay | Admitting: Interventional Cardiology

## 2014-04-10 DIAGNOSIS — R0789 Other chest pain: Secondary | ICD-10-CM

## 2014-04-10 NOTE — Progress Notes (Signed)
Exercise Treadmill Test  Pre-Exercise Testing Evaluation Rhythm: normal sinus  Rate: 56 bpm     Test  Exercise Tolerance Test Ordering MD: Casandra Doffing, MD  Interpreting MD: Casandra Doffing, MD  Unique Test No: 1  Treadmill:  1  Indication for ETT: chest pain - rule out ischemia  Contraindication to ETT: No   Stress Modality: exercise - treadmill  Cardiac Imaging Performed: non   Protocol: standard Bruce - maximal  Max BP:  180/67  Max MPHR (bpm):  169 85% MPR (bpm):  144  MPHR obtained (bpm):  173 % MPHR obtained:  102  Reached 85% MPHR (min:sec):  11:14 Total Exercise Time (min-sec):  14:11  Workload in METS:  17.2 Borg Scale: 15  Reason ETT Terminated:  max heart rate achieved    ST Segment Analysis At Rest: normal ST segments - no evidence of significant ST depression With Exercise: no evidence of significant ST depression  Other Information Arrhythmia:  No Angina during ETT:  absent (0) Quality of ETT:  diagnostic  ETT Interpretation:  normal - no evidence of ischemia by ST analysis  Comments: Excellent exercise tolerance.  Investigate non-cardiac causes of chest pain.  Recommendations: Continue preventive therapy.

## 2015-11-25 ENCOUNTER — Encounter (HOSPITAL_BASED_OUTPATIENT_CLINIC_OR_DEPARTMENT_OTHER): Payer: Self-pay | Admitting: Emergency Medicine

## 2015-11-25 ENCOUNTER — Emergency Department (HOSPITAL_BASED_OUTPATIENT_CLINIC_OR_DEPARTMENT_OTHER)
Admission: EM | Admit: 2015-11-25 | Discharge: 2015-11-25 | Disposition: A | Discharge status: Home / Self Care | Payer: 59 | Attending: Emergency Medicine | Admitting: Emergency Medicine

## 2015-11-25 DIAGNOSIS — K5900 Constipation, unspecified: Secondary | ICD-10-CM | POA: Diagnosis not present

## 2015-11-25 DIAGNOSIS — K6289 Other specified diseases of anus and rectum: Secondary | ICD-10-CM | POA: Diagnosis present

## 2015-11-25 LAB — OCCULT BLOOD X 1 CARD TO LAB, STOOL: Fecal Occult Bld: NEGATIVE

## 2015-11-25 MED ORDER — POLYETHYLENE GLYCOL 3350 17 GM/SCOOP PO POWD: ORAL | Status: DC

## 2015-11-25 MED FILL — POLYETHYLENE GLYCOL 3350: 15 days supply | Qty: 255 | Fill #0

## 2015-11-25 NOTE — ED Notes (Signed)
Pt suffering from hemorrhoids over the last two weeks.  Pt had gone to bathroom this am and was unable to have BM.  Pt states it felt like something was preventing the stool from exiting the rectum.  No bleeding currently.  No N/V/D or fever.

## 2015-11-25 NOTE — Discharge Instructions (Signed)
Your test today did not show any evidence of blood in your stool. I suspect your symptoms are due to constipation. I will give you a prescription for miralax. Please call your primary care provider to schedule a follow up appointment for early next week. Return to the ER for new or worsening symptoms such as severe abdominal pain, fever, nausea/vomiting, etc.

## 2015-11-25 NOTE — ED Provider Notes (Signed)
CSN: [id number]     Arrival date & time 11/25/15  [id number] History   First MD Initiated Contact with Patient 11/25/15 1006     Chief Complaint  Patient presents with  . Rectal Pain   HPI   Jonathan Todd is an 55 y.o. male who presents to the ED for evaluation of constipation. He states that for the past 2-3 weeks he has had hemorrhoids. He states he felt an external mass with painful BM and blood on wiping. He states he had been taking OTC stool softener, sitz baths, preparation H, and tucks pads. He states that his symptoms resolved about three days ago so he stopped taking the stool softener. He states he had a normal BM 2 days ago. States this AM he was trying to have a BM at work but could not go. He states it felt like something was blocking his stool. States he had mild rectal pain when straining. Denies current rectal bleeding. Denies abdominal pain, N/V, fever, chills.   Past Medical History  Diagnosis Date  . Back pain     secondary to L3,4 degenerative disc disease  . GERD (gastroesophageal reflux disease)   . Insomnia   . Melanoma 2010    L shoulder  . Ankle injury     R ankle  . Sinusitis 2008  . Dyspepsia     secondary to ASA 81mg  causing esophageal spasm  . DJD (degenerative joint disease), cervical   . Dermatitis 02/28/2013    subacute spongiotic dermatitis, R hip area   Past Surgical History  Procedure Laterality Date  . Facet joint injection  2007    x2  . Knee surgery  1992    arthroscopic  . Mohs surgery      shoulder   Family History  Problem Relation Age of Onset  . Heart attack Mother   . Cancer Mother     skin  . Heart disease Mother   . Cancer Sister     skin  . Hypertension Sister   . Hypertension Sister   . Heart attack Maternal Grandfather    Social History  Substance Use Topics  . Smoking status: Never Smoker   . Smokeless tobacco: Never Used  . Alcohol Use: Yes     Comment: 1-2 per week    Review of Systems  All other systems reviewed  and are negative.     Allergies  Aspirin and Codeine  Home Medications   Prior to Admission medications   Not on File   BP 122/82 mmHg  Pulse 92  Temp(Src) 98.1 F (36.7 C) (Oral)  Resp 18  Ht 6' (1.829 m)  Wt 81.647 kg  BMI 24.41 kg/m2  SpO2 96% Physical Exam  Constitutional: He is oriented to person, place, and time. No distress.  HENT:  Head: Atraumatic.  Right Ear: External ear normal.  Left Ear: External ear normal.  Nose: Nose normal.  Eyes: Conjunctivae are normal. No scleral icterus.  Neck: Normal range of motion. Neck supple.  Cardiovascular: Normal rate and regular rhythm.   Pulmonary/Chest: Effort normal. No respiratory distress. He exhibits no tenderness.  Abdominal: Soft. Bowel sounds are normal. He exhibits no distension. There is no tenderness. There is no rebound and no guarding.  Genitourinary:  No external hemorrhoids No internal masses palpable Hard stool in rectal vault Stool brown on glove with no gross blood No tenderness on exam  Neurological: He is alert and oriented to person, place, and time.  Skin:  Skin is warm and dry. He is not diaphoretic.  Psychiatric: He has a normal mood and affect. His behavior is normal.  Nursing note and vitals reviewed.   ED Course  Procedures (including critical care time) Labs Review Labs Reviewed  OCCULT BLOOD X 1 CARD TO LAB, STOOL    Imaging Review No results found. I have personally reviewed and evaluated these images and lab results as part of my medical decision-making.   EKG Interpretation None      MDM   Final diagnoses:  Constipation, unspecified constipation type    Suspect pt is constipated, likely from using stool softener for two weeks and now abruptly stopping. Will give rx for miralax and discussed gradual taper once symptoms improve. Pt with benign abdominal exam, no n/v, VSS. Doubt SBO or other acute/surgical intra-abdominal etiology at this time. Instructed close PCP f/u. ER  return precautions also discussed. Pt verbalized his agreement and understanding of plan,.    Anne Ng, PA-C 11/25/15 1111  Gareth Morgan, MD 11/25/15 [phone]

## 2016-05-10 ENCOUNTER — Ambulatory Visit (INDEPENDENT_AMBULATORY_CARE_PROVIDER_SITE_OTHER): Payer: 59 | Admitting: Podiatry

## 2016-05-10 ENCOUNTER — Ambulatory Visit (INDEPENDENT_AMBULATORY_CARE_PROVIDER_SITE_OTHER): Payer: 59

## 2016-05-10 ENCOUNTER — Encounter: Payer: Self-pay | Admitting: Podiatry

## 2016-05-10 VITALS — BP 116/76 | HR 60 | Resp 16 | Ht 72.0 in | Wt 180.0 lb

## 2016-05-10 DIAGNOSIS — M79672 Pain in left foot: Secondary | ICD-10-CM

## 2016-05-10 DIAGNOSIS — M722 Plantar fascial fibromatosis: Secondary | ICD-10-CM

## 2016-05-10 DIAGNOSIS — M79671 Pain in right foot: Secondary | ICD-10-CM

## 2016-05-10 MED ORDER — DICLOFENAC SODIUM 75 MG PO TBEC: 75.0000 mg | DELAYED_RELEASE_TABLET | Freq: Two times a day (BID) | ORAL | 2 refills | Status: AC

## 2016-05-10 MED ORDER — TRIAMCINOLONE ACETONIDE 10 MG/ML IJ SUSP
10.0000 mg | Freq: Once | INTRAMUSCULAR | Status: AC
  Administered 2016-05-10: 10 mg

## 2016-05-10 NOTE — Progress Notes (Signed)
   Subjective:    Patient ID: Jonathan Todd, male    DOB: 04-09-1961, 55 y.o.   MRN: [id number]  HPI Chief Complaint  Patient presents with  . Foot Pain    Bilateral; heel & lateral side; pt stated, "Saw Dr. Gershon Mussel in Dec. 2016 and was told had Plantar Fasciitis; got a cortisone injection; Right foot hurts more than Left; wants to get a 2nd opinion"  . Toe Pain    Left foot, 3rd toe; pt stated, "Fills up with fluid and gets hard, has had for years"      Review of Systems  All other systems reviewed and are negative.      Objective:   Physical Exam        Assessment & Plan:

## 2016-05-10 NOTE — Patient Instructions (Signed)

## 2016-05-10 NOTE — Progress Notes (Signed)
Subjective:     Patient ID: Jonathan Todd, male   DOB: 03-11-1961, 55 y.o.   MRN: [id number]  HPI patient states he still having pain in his heels and he states that he saw another doctor who gave him to cortisone injections but it's been 6 months and he's very tired and the pain and he wants a definitive solution. Has also worn insoles but not permanent and has had no other current treatments   Review of Systems  All other systems reviewed and are negative.      Objective:   Physical Exam  Constitutional: He is oriented to person, place, and time.  Cardiovascular: Intact distal pulses.   Musculoskeletal: Normal range of motion.  Neurological: He is oriented to person, place, and time.  Skin: Skin is warm.  Nursing note and vitals reviewed.  neurovascular status intact muscle strength adequate range of motion within normal limits with patient found to have exquisite discomfort plantar aspect right heel moderate discomfort plantar aspect left heel moderate depression of the arch with a narrow heel and quite a bit of discomfort after periods of sitting and after sleeping. He is noted to have good digital perfusion is well oriented 3 with a well-developed arch     Assessment:     Chronic plantar fasciitis right over left with inflammation and fluid around the medial band with a narrow heel and diminished fat pad as complicating factors    Plan:     H&P condition reviewed at great length. I did discuss ultimately this may require surgery for the right heel but we are going to try aggressive conservative therapy and today I injected each heel 3 mg Kenalog 5 mg Xylocaine and applied fascial brace bilateral gave instructions on physical therapy supportive shoes placed on oral anti-inflammatories dispensed night splint and reappoint in 2 weeks to reevaluate  X-ray report indicated spur formation bilateral with well-developed arch

## 2016-05-24 ENCOUNTER — Ambulatory Visit (INDEPENDENT_AMBULATORY_CARE_PROVIDER_SITE_OTHER): Payer: 59 | Admitting: Podiatry

## 2016-05-24 DIAGNOSIS — M722 Plantar fascial fibromatosis: Secondary | ICD-10-CM | POA: Diagnosis not present

## 2016-05-25 NOTE — Progress Notes (Signed)
Subjective:     Patient ID: Jonathan Todd, male   DOB: 1960/08/18, 55 y.o.   MRN: [id number]  HPI patient presents stating he is improving but he has had a one year history of heel pain   Review of Systems     Objective:   Physical Exam Neurovascular status intact with patient's heel pain improved and has had numerous conservative treatments but has not had mechanical control    Assessment:     Chronic plantar fasciitis right over left    Plan:     Discussed condition and at this time scanned for customized orthotics to reduce plantar stress. I then reviewed with him the gradual increase in activity levels with consideration for running is doing well over the next month and that we may have to limit certain activities. Spent a great of time going over this and also shoe gear modifications

## 2019-08-27 ENCOUNTER — Telehealth: Payer: Self-pay | Admitting: Internal Medicine

## 2019-08-27 NOTE — Telephone Encounter (Signed)
Called to discuss with Jonathan Todd about Covid symptoms and the use of bamlanivimab, a monoclonal antibody infusion for those with mild to moderate Covid symptoms and at a high risk of hospitalization.     Pt is not qualified for this infusion due to lack of identified risk factors and co-morbid conditions.  Symptoms reviewed as well as criteria for ending isolation.  Symptoms reviewed that would warrant ED/Hospital evaluation as well should her condition worsen. Preventative practices reviewed. Patient verbalized understanding.   There are no problems to display for this patient.   Alan Ripper, NP-C [facility] [place]  pgr [phone]

## 2019-09-01 ENCOUNTER — Emergency Department (HOSPITAL_COMMUNITY): Payer: 59

## 2019-09-01 ENCOUNTER — Emergency Department (HOSPITAL_COMMUNITY)
Admission: EM | Admit: 2019-09-01 | Discharge: 2019-09-01 | Disposition: A | Discharge status: Home / Self Care | Payer: 59 | Attending: Emergency Medicine | Admitting: Emergency Medicine

## 2019-09-01 ENCOUNTER — Other Ambulatory Visit: Payer: Self-pay

## 2019-09-01 ENCOUNTER — Encounter (HOSPITAL_COMMUNITY): Payer: Self-pay

## 2019-09-01 DIAGNOSIS — U071 COVID-19: Secondary | ICD-10-CM | POA: Diagnosis not present

## 2019-09-01 DIAGNOSIS — R0602 Shortness of breath: Secondary | ICD-10-CM | POA: Diagnosis present

## 2019-09-01 LAB — COMPREHENSIVE METABOLIC PANEL
ALT: 30 U/L (ref 0–44)
AST: 55 U/L — ABNORMAL HIGH (ref 15–41)
Albumin: 3.8 g/dL (ref 3.5–5.0)
Alkaline Phosphatase: 55 U/L (ref 38–126)
Anion gap: 13 (ref 5–15)
BUN: 10 mg/dL (ref 6–20)
CO2: 28 mmol/L (ref 22–32)
Calcium: 8.4 mg/dL — ABNORMAL LOW (ref 8.9–10.3)
Chloride: 93 mmol/L — ABNORMAL LOW (ref 98–111)
Creatinine, Ser: 0.92 mg/dL (ref 0.61–1.24)
GFR calc Af Amer: >60 mL/min (ref >60)
GFR calc non Af Amer: >60 mL/min (ref >60)
Glucose, Bld: 102 mg/dL — ABNORMAL HIGH (ref 70–99)
Potassium: 3.6 mmol/L (ref 3.5–5.1)
Sodium: 134 mmol/L — ABNORMAL LOW (ref 135–145)
Total Bilirubin: 1 mg/dL (ref 0.3–1.2)
Total Protein: 7 g/dL (ref 6.5–8.1)

## 2019-09-01 LAB — CBC WITH DIFFERENTIAL/PLATELET
Abs Immature Granulocytes: 0.03 10*3/uL (ref 0.00–0.07)
Basophils Absolute: 0 10*3/uL (ref 0.0–0.1)
Basophils Relative: 0 %
Eosinophils Absolute: 0 10*3/uL (ref 0.0–0.5)
Eosinophils Relative: 0 %
HCT: 44 % (ref 39.0–52.0)
Hemoglobin: 14.7 g/dL (ref 13.0–17.0)
Immature Granulocytes: 1 %
Lymphocytes Relative: 17 %
Lymphs Abs: 1 10*3/uL (ref 0.7–4.0)
MCH: 30.1 pg (ref 26.0–34.0)
MCHC: 33.4 g/dL (ref 30.0–36.0)
MCV: 90.2 fL (ref 80.0–100.0)
Monocytes Absolute: 0.6 10*3/uL (ref 0.1–1.0)
Monocytes Relative: 10 %
Neutro Abs: 4.5 10*3/uL (ref 1.7–7.7)
Neutrophils Relative %: 72 %
Platelets: 178 10*3/uL (ref 150–400)
RBC: 4.88 MIL/uL (ref 4.22–5.81)
RDW: 12.6 % (ref 11.5–15.5)
WBC: 6.2 10*3/uL (ref 4.0–10.5)
nRBC: 0 % (ref 0.0–0.2)

## 2019-09-01 LAB — URINALYSIS, ROUTINE W REFLEX MICROSCOPIC
Bilirubin Urine: NEGATIVE
Glucose, UA: NEGATIVE mg/dL
Hgb urine dipstick: NEGATIVE
Ketones, ur: NEGATIVE mg/dL
Leukocytes,Ua: NEGATIVE
Nitrite: NEGATIVE
Protein, ur: NEGATIVE mg/dL
Specific Gravity, Urine: 1.006 (ref 1.005–1.030)
pH: 7 (ref 5.0–8.0)

## 2019-09-01 LAB — TRIGLYCERIDES: Triglycerides: 47 mg/dL (ref <150)

## 2019-09-01 LAB — D-DIMER, QUANTITATIVE: D-Dimer, Quant: 0.71 ug/mL-FEU — ABNORMAL HIGH (ref 0.00–0.50)

## 2019-09-01 LAB — FERRITIN: Ferritin: 452 ng/mL — ABNORMAL HIGH (ref 24–336)

## 2019-09-01 LAB — LACTIC ACID, PLASMA
Lactic Acid, Venous: 2.4 mmol/L (ref 0.5–1.9)
Lactic Acid, Venous: 1.2 mmol/L (ref 0.5–1.9)

## 2019-09-01 LAB — PROCALCITONIN: Procalcitonin: <0.1 ng/mL

## 2019-09-01 LAB — C-REACTIVE PROTEIN: CRP: 7.2 mg/dL — ABNORMAL HIGH (ref <1.0)

## 2019-09-01 LAB — FIBRINOGEN: Fibrinogen: 370 mg/dL (ref 210–475)

## 2019-09-01 LAB — LACTATE DEHYDROGENASE: LDH: 319 U/L — ABNORMAL HIGH (ref 98–192)

## 2019-09-01 LAB — TROPONIN I (HIGH SENSITIVITY)
Troponin I (High Sensitivity): 5 ng/L (ref <18)
Troponin I (High Sensitivity): 6 ng/L (ref <18)

## 2019-09-01 MED ORDER — BENZONATATE 100 MG PO CAPS: 100.0000 mg | ORAL_CAPSULE | Freq: Three times a day (TID) | ORAL | 0 refills | Status: AC

## 2019-09-01 MED ORDER — SODIUM CHLORIDE 0.9 % IV BOLUS
500.0000 mL | Freq: Once | INTRAVENOUS | Status: AC
  Administered 2019-09-01: 500 mL via INTRAVENOUS

## 2019-09-01 MED ORDER — ACETAMINOPHEN 325 MG PO TABS
650.0000 mg | ORAL_TABLET | Freq: Once | ORAL | Status: AC
  Administered 2019-09-01: 650 mg via ORAL
  Filled 2019-09-01: qty 2

## 2019-09-01 NOTE — ED Notes (Signed)
Date and time results received: 09/01/19 18:57 (use smartphrase ".now" to insert current time)  Test: Lactic Acid  Critical Value: 18:57  Name of Provider Notified: Chrys Racer, [place]   Orders Received? Or Actions Taken?: Continue to monitor patient and await new orders.

## 2019-09-01 NOTE — ED Notes (Signed)
IV team at bedside 

## 2019-09-01 NOTE — ED Notes (Signed)
Pt provided urinal.

## 2019-09-01 NOTE — ED Provider Notes (Signed)
[facility] DEPT Provider Note   CSN: [id number] Arrival date & time: 09/01/19  1616     History Chief Complaint  Patient presents with  . Covid Pos    PAM SEATS is a 59 y.o. male with no significant past medical history who presents to the ED due to worsening shortness of breath. Patient tested positive for COVID 11 days ago, per patient. He admits to generalized weakness, myalgias, and decreased appetite. Patient notes he hasn't had a bowel movement in 8-9 days due to his poor po intake. He admits to intermittent fevers. Denies current chest pain, but notes he had a little bit of central, non-radiating chest pain 2-3 days ago. Patient also admits to dysuria for the past few days. He denies abdominal pain, nausea, vomiting, and diarrhea. He has tried Tylenol and cough syrup for his symptoms with mild relief. Patient denies history of blood clots, recent surgeries, recent long immobilization, and hormonal treatments.     Past Medical History:  Diagnosis Date  . Ankle injury    R ankle  . Back pain    secondary to L3,4 degenerative disc disease  . Dermatitis 02/28/2013   subacute spongiotic dermatitis, R hip area  . DJD (degenerative joint disease), cervical   . Dyspepsia    secondary to ASA 81mg  causing esophageal spasm  . GERD (gastroesophageal reflux disease)   . Insomnia   . Melanoma ([place]) 2010   L shoulder  . Sinusitis 2008    There are no problems to display for this patient.   Past Surgical History:  Procedure Laterality Date  . FACET JOINT INJECTION  2007   x2  . KNEE SURGERY  1992   arthroscopic  . MOHS SURGERY     shoulder       Family History  Problem Relation Age of Onset  . Heart attack Mother   . Cancer Mother        skin  . Heart disease Mother   . Cancer Sister        skin  . Hypertension Sister   . Hypertension Sister   . Heart attack Maternal Grandfather     Social History   Tobacco Use  . Smoking  status: Never Smoker  . Smokeless tobacco: Never Used  Substance Use Topics  . Alcohol use: Yes    Comment: 1-2 per week  . Drug use: No    Home Medications Prior to Admission medications   Medication Sig Start Date End Date Taking? Authorizing Provider  HYDROcodone-homatropine (HYCODAN) 5-1.5 MG/5ML syrup Take 5 mLs by mouth every 6 (six) hours as needed for cough. 08/27/19  Yes [provider]  benzonatate (TESSALON) 100 MG capsule Take 1 capsule (100 mg total) by mouth every 8 (eight) hours. 09/01/19   Suzy Bouchard, PA-C  diclofenac (VOLTAREN) 75 MG EC tablet Take 1 tablet (75 mg total) by mouth 2 (two) times daily. Patient not taking: Reported on 09/01/2019 05/10/16   Wallene Huh, DPM    Allergies    Aspirin and Codeine  Review of Systems   Review of Systems  Constitutional: Positive for activity change, appetite change, chills and fever.  HENT: Negative for congestion and sore throat.   Respiratory: Positive for cough and shortness of breath.   Cardiovascular: Positive for chest pain (2-3 days ago). Negative for leg swelling.  Gastrointestinal: Positive for constipation. Negative for abdominal pain, diarrhea, nausea and vomiting.  Genitourinary: Positive for dysuria. Negative for flank pain.  All other systems reviewed and are negative.   Physical Exam Updated Vital Signs BP 125/61   Pulse 94   Temp (!) 102.6 F (39.2 C) (Oral)   Resp (!) 22   Ht 6' (1.829 m)   Wt 81 kg   SpO2 98%   BMI 24.22 kg/m   Physical Exam Vitals and nursing note reviewed.  Constitutional:      General: He is not in acute distress.    Appearance: He is not toxic-appearing.  HENT:     Head: Normocephalic.  Eyes:     Pupils: Pupils are equal, round, and reactive to light.  Neck:     Comments: No meningismus. Full ROM of neck Cardiovascular:     Rate and Rhythm: Normal rate and regular rhythm.     Pulses: Normal pulses.     Heart sounds: Normal heart sounds. No murmur.  No friction rub. No gallop.   Pulmonary:     Effort: Pulmonary effort is normal.     Breath sounds: Normal breath sounds.  Abdominal:     General: Abdomen is flat. Bowel sounds are normal. There is no distension.     Palpations: Abdomen is soft.     Tenderness: There is no abdominal tenderness. There is no guarding or rebound.     Comments: Abdomen soft, nondistended, nontender to palpation in all quadrants without guarding or peritoneal signs. No rebound.   Musculoskeletal:     Cervical back: Neck supple.     Comments: Able to move all 4 extremities without difficulty. No lower extremity edema. Negative homans sign bilaterally.  Skin:    General: Skin is warm and dry.  Neurological:     General: No focal deficit present.     Mental Status: He is alert.  Psychiatric:        Mood and Affect: Mood normal.        Behavior: Behavior normal.     ED Results / Procedures / Treatments   Labs (all labs ordered are listed, but only abnormal results are displayed) Labs Reviewed  LACTIC ACID, PLASMA - Abnormal; Notable for the following components:      Result Value   Lactic Acid, Venous 2.4 (*)    All other components within normal limits  COMPREHENSIVE METABOLIC PANEL - Abnormal; Notable for the following components:   Sodium 134 (*)    Chloride 93 (*)    Glucose, Bld 102 (*)    Calcium 8.4 (*)    AST 55 (*)    All other components within normal limits  D-DIMER, QUANTITATIVE (NOT AT [facility]) - Abnormal; Notable for the following components:   D-Dimer, Quant 0.71 (*)    All other components within normal limits  LACTATE DEHYDROGENASE - Abnormal; Notable for the following components:   LDH 319 (*)    All other components within normal limits  FERRITIN - Abnormal; Notable for the following components:   Ferritin 452 (*)    All other components within normal limits  C-REACTIVE PROTEIN - Abnormal; Notable for the following components:   CRP 7.2 (*)    All other components within normal  limits  CULTURE, BLOOD (ROUTINE X 2)  CULTURE, BLOOD (ROUTINE X 2)  LACTIC ACID, PLASMA  CBC WITH DIFFERENTIAL/PLATELET  PROCALCITONIN  TRIGLYCERIDES  FIBRINOGEN  URINALYSIS, ROUTINE W REFLEX MICROSCOPIC  TROPONIN I (HIGH SENSITIVITY)  TROPONIN I (HIGH SENSITIVITY)    EKG EKG Interpretation  Date/Time:  Monday September 01 2019 16:40:25 EST Ventricular Rate:  75  PR Interval:    QRS Duration: 89 QT Interval:  358 QTC Calculation: 400 R Axis:   94 Text Interpretation: Sinus rhythm Borderline right axis deviation Probable anteroseptal infarct, old Confirmed by Virgel Manifold [phone]) on 09/01/2019 4:48:22 PM   Radiology DG Chest Port 1 View  Result Date: 09/01/2019 CLINICAL DATA:  Shortness of breath and weakness. EXAM: PORTABLE CHEST 1 VIEW COMPARISON:  February 28, 2005 FINDINGS: The heart size and mediastinal contours are within normal limits. Both lungs are clear. The visualized skeletal structures are unremarkable. IMPRESSION: No active disease. Electronically Signed   By: Virgina Norfolk M.D.   On: 09/01/2019 18:46    Procedures Procedures (including critical care time)  Medications Ordered in ED Medications  acetaminophen (TYLENOL) tablet 650 mg (650 mg Oral Given 09/01/19 1829)  sodium chloride 0.9 % bolus 500 mL (500 mLs Intravenous New Bag/Given (Non-Interop) 09/01/19 2040)    ED Course  I have reviewed the triage vital signs and the nursing notes.  Pertinent labs & imaging results that were available during my care of the patient were reviewed by me and considered in my medical decision making (see chart for details).    MDM Rules/Calculators/A&P                     59 year old male presents to the ED due to worsening shortness of breath, generalized weakness, and dysuria. Patient tested positive for COVID 11 days ago. Upon arrival, patient febrile at 102.6, but otherwise unremarkable vitals. Patient is not tachycardic or hypoxic. Patient in no acute distress and  non-toxic appearing. Physical exam reassuring. No lower extremity edema. Negative homans sign bilaterally. No meningismus. Doubt meningitis. Will obtain COVID labs, troponin, and EKG.  CBC unremarkable with no leukocytosis. CMP reassuring with normal renal function. Slight hyponatremia at 134 likely due to dehydration. D-dimer mildly elevated at 0.71 likely due to COVID infection presentation not concerning for PE/DVT. Lactic acid elevated at 2.4. Will give small amount of IVFs. Initial troponin normal. Will obtain delta troponin.  Chest x-ray personally reviewed which is negative for signs of pneumonia.  EKG personally reviewed which demonstrates sinus rhythm and right axis deviation, but no signs of ischemia. 2nd lactic acid normal at 1.2. Delta troponin normal. Suspect chest pain related to COVID infection. Doubt ACS given normal troponin and EKG. Discussed case with Dr. Ralene Bathe who evaluated patient at bedside and agrees with assessment and plan.   Patient handed off to [facility], PA-C who will follow-up with UA, reassess patient, and determine disposition. If UA is normal, patient can be discharged home with PCP follow-up.   DEZION JEANLOUIS was evaluated in Emergency Department on 09/01/2019 for the symptoms described in the history of present illness. He was evaluated in the context of the global COVID-19 pandemic, which necessitated consideration that the patient might be at risk for infection with the SARS-CoV-2 virus that causes COVID-19. Institutional protocols and algorithms that pertain to the evaluation of patients at risk for COVID-19 are in a state of rapid change based on information released by regulatory bodies including the CDC and federal and state organizations. These policies and algorithms were followed during the patient's care in the ED. Final Clinical Impression(s) / ED Diagnoses Final diagnoses:  COVID-19 virus infection    Rx / DC Orders ED Discharge Orders          Ordered    benzonatate (TESSALON) 100 MG capsule  Every 8 hours  09/01/19 2114           Suzy Bouchard, PA-C 09/01/19 2117    Quintella Reichert, MD 09/04/19 [phone]

## 2019-09-01 NOTE — ED Notes (Signed)
IV team collected lactic and trop

## 2019-09-01 NOTE — ED Triage Notes (Signed)
Pt was tested about a week ago, and tested pos. Pt has felt increasingly SOB and weak. Pt saturation 98% RA while at rest. Denies N/V/D, poor PO intake due to lack of appetite, pt also denies chest pain, but does say his sides hurt just above his hips.

## 2019-09-01 NOTE — ED Notes (Signed)
Pt ambulated in room x 2 minutes while oxygen level was monitored. Pt's oxygen level maintained at or above 95% throughout the duration.

## 2019-09-01 NOTE — Discharge Instructions (Addendum)
As discussed, your chest x-ray was negative for pneumonia. All of your labs looked good. I would suspect your symptoms are related to COVID. I am sending you home with more cough medication. Take as prescribed. You may take over the counter ibuprofen or tylenol as needed for fever and muscle aches. If you symptoms do not improve within the next week, follow-up with your PCP. Return to the ER if you develop worsening shortness of breath, central chest pain, or worsening symptoms.

## 2019-09-06 LAB — CULTURE, BLOOD (ROUTINE X 2)
Culture: NO GROWTH
Culture: NO GROWTH
Special Requests: ADEQUATE

## 2021-07-03 IMAGING — DX DG CHEST 1V PORT
1 series · 1 of 1 positions shown · non-contrast
Comparison: February 28, 2005

CLINICAL DATA: Shortness of breath and weakness.

EXAM:
PORTABLE CHEST 1 VIEW

[chest ap]
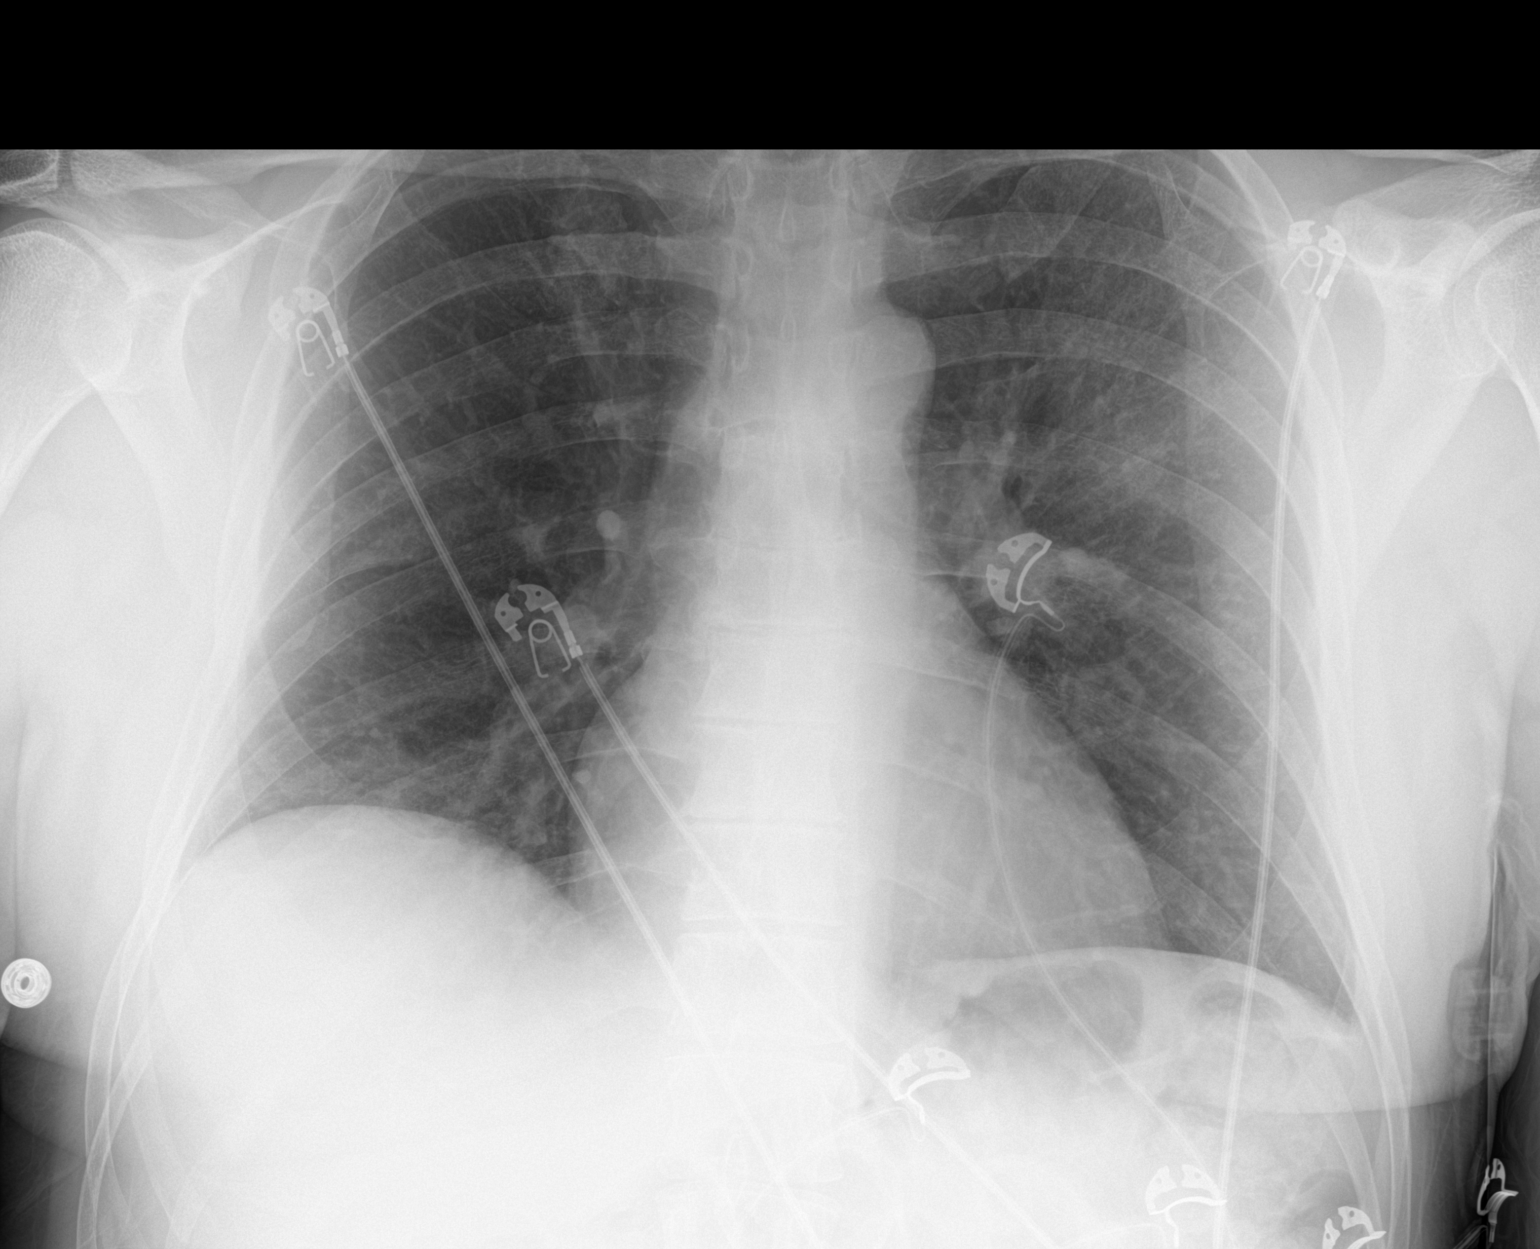

[1 of 1 positions shown; findings below may reference images not displayed]

FINDINGS: The heart size and mediastinal contours are within normal limits.
Both lungs are clear. The visualized skeletal structures are
unremarkable.
IMPRESSION: No active disease.

## 2022-05-12 ENCOUNTER — Other Ambulatory Visit: Payer: Self-pay | Admitting: Internal Medicine

## 2022-05-12 DIAGNOSIS — Z8249 Family history of ischemic heart disease and other diseases of the circulatory system: Secondary | ICD-10-CM

## 2022-05-19 ENCOUNTER — Ambulatory Visit
Admission: RE | Admit: 2022-05-19 | Discharge: 2022-05-19 | Disposition: A | Discharge status: Home / Self Care | Payer: No Typology Code available for payment source | Source: Ambulatory Visit | Attending: Internal Medicine | Admitting: Internal Medicine

## 2022-05-19 DIAGNOSIS — Z8249 Family history of ischemic heart disease and other diseases of the circulatory system: Secondary | ICD-10-CM

## 2024-08-19 ENCOUNTER — Other Ambulatory Visit: Payer: Self-pay | Admitting: Urology

## 2024-08-19 DIAGNOSIS — R972 Elevated prostate specific antigen [PSA]: Secondary | ICD-10-CM

## 2024-09-12 ENCOUNTER — Other Ambulatory Visit
